# Patient Record
Sex: Female | Born: 1970 | Race: White | Hispanic: No | Smoking: Current every day smoker
Health system: Southern US, Community
[De-identification: ages and names within clinical notes are randomized; demographics above are authoritative.]

## PROBLEM LIST (undated history)

## (undated) DIAGNOSIS — F4001 Agoraphobia with panic disorder: Secondary | ICD-10-CM

## (undated) DIAGNOSIS — L709 Acne, unspecified: Secondary | ICD-10-CM

## (undated) DIAGNOSIS — F17200 Nicotine dependence, unspecified, uncomplicated: Secondary | ICD-10-CM

## (undated) DIAGNOSIS — E7800 Pure hypercholesterolemia, unspecified: Secondary | ICD-10-CM

## (undated) DIAGNOSIS — F419 Anxiety disorder, unspecified: Secondary | ICD-10-CM

## (undated) DIAGNOSIS — Z8669 Personal history of other diseases of the nervous system and sense organs: Secondary | ICD-10-CM

## (undated) DIAGNOSIS — K449 Diaphragmatic hernia without obstruction or gangrene: Secondary | ICD-10-CM

## (undated) DIAGNOSIS — I10 Essential (primary) hypertension: Secondary | ICD-10-CM

## (undated) HISTORY — DX: Acne, unspecified: L70.9

## (undated) HISTORY — DX: Nicotine dependence, unspecified, uncomplicated: F17.200

## (undated) HISTORY — DX: Diaphragmatic hernia without obstruction or gangrene: K44.9

## (undated) HISTORY — DX: Agoraphobia with panic disorder: F40.01

## (undated) HISTORY — DX: Anxiety disorder, unspecified: F41.9

## (undated) HISTORY — DX: Essential (primary) hypertension: I10

## (undated) HISTORY — DX: Pure hypercholesterolemia, unspecified: E78.00

## (undated) HISTORY — DX: Personal history of other diseases of the nervous system and sense organs: Z86.69

---

## 1998-02-25 ENCOUNTER — Ambulatory Visit (HOSPITAL_COMMUNITY): Admission: RE | Admit: 1998-02-25 | Discharge: 1998-02-25 | Payer: Self-pay | Admitting: Gastroenterology

## 1998-05-26 ENCOUNTER — Ambulatory Visit (HOSPITAL_COMMUNITY): Admission: RE | Admit: 1998-05-26 | Discharge: 1998-05-26 | Payer: Self-pay | Admitting: Obstetrics and Gynecology

## 1998-05-26 ENCOUNTER — Encounter: Payer: Self-pay | Admitting: Obstetrics and Gynecology

## 1998-05-30 ENCOUNTER — Encounter: Admission: RE | Admit: 1998-05-30 | Discharge: 1998-05-30 | Payer: Self-pay | Admitting: Internal Medicine

## 1998-11-07 ENCOUNTER — Encounter: Payer: Self-pay | Admitting: Gynecology

## 1998-11-07 ENCOUNTER — Encounter: Admission: RE | Admit: 1998-11-07 | Discharge: 1998-11-07 | Payer: Self-pay | Admitting: Gynecology

## 2000-10-12 ENCOUNTER — Encounter (INDEPENDENT_AMBULATORY_CARE_PROVIDER_SITE_OTHER): Payer: Self-pay | Admitting: Specialist

## 2000-10-12 ENCOUNTER — Inpatient Hospital Stay (HOSPITAL_COMMUNITY): Admission: RE | Admit: 2000-10-12 | Discharge: 2000-10-13 | Payer: Self-pay | Admitting: Gynecology

## 2001-05-11 ENCOUNTER — Other Ambulatory Visit: Admission: RE | Admit: 2001-05-11 | Discharge: 2001-05-11 | Payer: Self-pay | Admitting: Gynecology

## 2002-05-14 ENCOUNTER — Other Ambulatory Visit: Admission: RE | Admit: 2002-05-14 | Discharge: 2002-05-14 | Payer: Self-pay | Admitting: Gynecology

## 2003-06-05 ENCOUNTER — Other Ambulatory Visit: Admission: RE | Admit: 2003-06-05 | Discharge: 2003-06-05 | Payer: Self-pay | Admitting: Gynecology

## 2003-12-18 ENCOUNTER — Other Ambulatory Visit: Admission: RE | Admit: 2003-12-18 | Discharge: 2003-12-18 | Payer: Self-pay | Admitting: Gynecology

## 2004-07-16 ENCOUNTER — Other Ambulatory Visit: Admission: RE | Admit: 2004-07-16 | Discharge: 2004-07-16 | Payer: Self-pay | Admitting: Gynecology

## 2004-09-01 ENCOUNTER — Encounter: Admission: RE | Admit: 2004-09-01 | Discharge: 2004-09-01 | Payer: Self-pay | Admitting: Family Medicine

## 2005-04-28 ENCOUNTER — Encounter: Admission: RE | Admit: 2005-04-28 | Discharge: 2005-04-28 | Payer: Self-pay | Admitting: Family Medicine

## 2006-12-06 ENCOUNTER — Other Ambulatory Visit: Admission: RE | Admit: 2006-12-06 | Discharge: 2006-12-06 | Payer: Self-pay | Admitting: Gynecology

## 2007-10-04 ENCOUNTER — Emergency Department (HOSPITAL_COMMUNITY): Admission: EM | Admit: 2007-10-04 | Discharge: 2007-10-04 | Payer: Self-pay | Admitting: Emergency Medicine

## 2008-02-09 ENCOUNTER — Ambulatory Visit (HOSPITAL_COMMUNITY): Admission: RE | Admit: 2008-02-09 | Discharge: 2008-02-09 | Payer: Self-pay | Admitting: General Surgery

## 2008-02-09 ENCOUNTER — Encounter (INDEPENDENT_AMBULATORY_CARE_PROVIDER_SITE_OTHER): Payer: Self-pay | Admitting: General Surgery

## 2010-04-27 LAB — HCG, SERUM, QUALITATIVE: Preg, Serum: NEGATIVE

## 2010-04-27 LAB — HEMOGLOBIN AND HEMATOCRIT, BLOOD
HCT: 40.4 % (ref 36.0–46.0)
Hemoglobin: 13.5 g/dL (ref 12.0–15.0)

## 2010-05-26 NOTE — Op Note (Signed)
Victoria Ballard, Victoria Ballard               ACCOUNT NO.:  [id number]   MEDICAL RECORD NO.:  [id number]          PATIENT TYPE:  AMB   LOCATION:  DAY                          FACILITY:  [facility]   PHYSICIAN:  Juanetta Gosling, MDDATE OF BIRTH:  07/28/1970   DATE OF PROCEDURE:  02/09/2008  DATE OF DISCHARGE:                               OPERATIVE REPORT   PREOPERATIVE DIAGNOSIS:  Biliary colic.   POSTOPERATIVE DIAGNOSIS:  Chronic cholecystitis   PROCEDURE:  Laparoscopic cholecystectomy.   SURGEON:  Juanetta Gosling, M.D.   ASSISTANT:  None.   ANESTHESIA:  General.   FINDINGS:  Chronic cholecystitis.   SPECIMENS:  Gallbladder and contents to pathology.   COMPLICATIONS:  None.   DRAINS:  None.   ESTIMATED BLOOD LOSS:  Minimal.   DISPOSITION:  To recovery room in stable condition.   INDICATIONS:  Victoria Ballard is a 40 year old female who has had some  epigastric burning and right upper quadrant pain, radiating to her back.  She underwent an ultrasound showing cholelithiasis, no wall-thickening,  no pericholecystic fluid, no sonographic Murphy's and a normal common  bile duct.  Her liver function tests and lipase were also normal.  On  her exam she was mildly tender in the right upper quadrant.  I counseled  her for laparoscopic, possible open cholecystectomy.   PROCEDURE:  After informed consent was obtained, the patient was taken  to the operating.  She was administered 1 gram of cefoxitin.  Sequential  compressive devices were placed on her lower extremities prior to  induction.  She was then placed under general endotracheal anesthesia  without complication.  Her abdomen was then prepped and draped in  standard sterile surgical fashion.  The surgical time-out was then  performed.   A 10-mm vertical incision was then made below the umbilicus.  Dissection  was carried out down to the level of the fascia, which was entered  sharply.  The peritoneum was then entered bluntly.  A 0  Vicryl  pursestring suture was then placed through her fascia and a Hasson  trocar introduced.  Her abdomen was then insufflated to 15 mmHg  pressure.  Three further 5-mm ports were placed in the epigastrium and  right upper quadrant after infiltration with local anesthetic under  direct vision.   The gallbladder was then retracted cephalad.  There were some adhesions  to her duodenum which were taken down bluntly.  Eventually the  gallbladder was retracted cephalad and lateral.  The triangle of Calot  was then dissected, obtaining the critical view of safety with the  cystic duct and cystic artery present.  At this point I then placed  three clips proximally and one clip distally on the cystic duct and  divided it and did the same with the cystic artery.  Hook cautery was  then used to remove the gallbladder from the liver bed.  The gallbladder  was very adherent to her liver bed and there was a small spillage of  bile at the end of this portion.  The gallbladder was then removed from  the liver bed, placed in  an EndoCatch bag and removed from the  umbilicus.  Irrigation was then performed with 2 liters of saline until  this was clear.  Hemostasis was observed on the liver bed.   The umbilical trocar was then removed.  This was tied down and there was  no further defect after securing that suture.  The remainder of the  trocars were removed and the abdomen was desufflated.  All the incisions  were then closed with 4-0 Monocryl in subcuticular fashion.  Dermabond  was placed over all the wounds.  She was extubated in the operating and  transferred to the recovery room in stable condition, having tolerated  this well.      Juanetta Gosling, MD  Electronically Signed     MCW/MEDQ  D:  02/09/2008  T:  02/09/2008  Job:  [id number]   cc:   L. Lupe Carney, M.D.  Fax: [phone]

## 2010-05-29 NOTE — Op Note (Signed)
[facility]  Patient:    Victoria Ballard, Victoria Ballard Visit Number: [id number] MRN: [id number]          Service Type: GYN Location: 1S X004 01 Attending Physician:  Teodora Medici Cabitt Proc. Date: 10/12/00 Admit Date:  10/12/2000   CC:         Harl Bowie, M.D.  Beather Arbour Thomasena Edis, M.D., Ph.D.   Operative Report  PREOPERATIVE DIAGNOSES: 1. Ovarian cyst. 2. Pelvic pain.  POSTOPERATIVE DIAGNOSIS: 1. Ovarian cyst. 2. Pelvic pain. 3. Adhesions.  OPERATION PERFORMED:  A left ovarian cystectomy and lysis of adhesions.  SURGEON:  Leatha Gilding. Mezer, M.D.  ASSISTANT:  Harl Bowie, M.D.  ANESTHESIA:  General endotracheal.  PREPARATION:  Betadine.  DESCRIPTION OF PROCEDURE:  With the patient in the supine position, was prepped and draped in the routine fashion.  A Pfannenstiel incision was made through the skin and subcutaneous tissue.  The fascia and peritoneum were opened without difficulty.  Upon entering the peritoneal cavity, pelvic washings were obtained.  Exploration of the pelvis revealed the uterus to be normal in size and contour.  The right ovary was lightly adherent to the pelvic sidewall, and the right fallopian tube was free of adhesions.  The left ovary was densely adherent to the pelvic sidewall and had dense adhesions of bowel to the ovary.  The fallopian tube was lightly adherent to the ovary. The bowel was freed from the ovary with careful blunt and sharp dissection. The ovary was freed enough from the pelvic sidewall to be able to approach the ovarian cyst.  The cyst was adherent to the bowel and leaked chocolate fluid during the dissection process.  The ovary was then opened on the superior pole.  The shell of the endometrioma, I dissected free, and this was also accomplished with the lower cyst.  Both cysts were closed with Georgiann Mccoy type sutures of 3-0 Vicryl.  It should be noted that the tissue throughout the pelvis was  thickened from either endometriosis or reaction to endometriosis. The fimbria, although present, appeared to be agglutinated bilaterally.  The pelvis was irrigated with copious amounts of warm Lactated Ringers solution, and hemostasis was noted to be intact.  At the completion of the procedure, an effort was made to place the large bowel in the cul-de-sac.  The omentum was brought down, and the abdomen was closed in layers using a running 2-0 Vicryl on the peritoneum, running 0 Vicryl to the midline bilaterally on the fascia. Hemostasis was assured in the subcutaneous tissue, and the skin was closed with staples.  The estimated blood loss was approximately 100 cc.  Sponge, needle and instrument counts were correct x 2.  The patient tolerated the procedure well and was taken to the recovery room in satisfactory condition. Attending Physician:  Rolinda Roan DD:  10/12/00 TD:  10/12/00 Job: [id number]

## 2010-05-29 NOTE — H&P (Signed)
[facility]  Patient:    Victoria Ballard, Victoria Ballard. Visit Number: [id number] MRN: [id number]          Service Type: Attending:  Leatha Gilding. Mezer, M.D. Dictated by:   Leatha Gilding. Mezer, M.D. Adm. Date:  10/12/00   CC:         Lafonda Mosses B. Thomasena Edis, M.D.   History and Physical  ADMITTING DIAGNOSES:  Ovarian cyst.  HISTORY OF PRESENT ILLNESS:  The patient is a 40 year old nulligravida white female admitted with persistent ovarian cyst and pelvic pain, for exploratory laparotomy.  The patient has a long history of dysmenorrhea, dyspareunia, and pelvic pain, and underwent a laparoscopy in September 2001, at which time severe deep endometriosis of the bladder, cul-de-sac, and ovaries was noted. The left ovary was adherent to the pelvic sidewall and was freed.  The left tube was adherent to the ovary, and this was also freed.  The patient has continued to have significant episodes of pelvic pain since that surgery and has a persistent left ovarian cyst.  Her CA-125 is 6.3, with normal being up to 21.  The patients pain has been unresponsive to oral contraceptive and conservative management.  Exploratory laparotomy has been discussed with the patient in detail, and efforts will be made to remove the ovarian cyst and to spare the ovary.  Given the previous significant endometriosis and extensive adhesions, the possibility that the tube or ovary would need to be removed has been addressed with the patient.  The possibility of ovarian cancer has been discussed with the patient.  The potential complications of the surgery, including but not limited to anesthesia, injury to the bowel, bladder, ureters, and possible blood loss which might require transfusion, possible infection of the pelvis with the wound, have been discussed with the patient in detail.  The postoperative expectations and restrictions have also been reviewed.  The patients questions have been answered, and she  understands that there is no guarantee to relieve her pain or to achieve pregnancy as a result of this surgery.  PAST SURGICAL HISTORY:  Diagnostic laparoscopy.  PAST MEDICAL HISTORY:  Noncontributory.  MEDICATIONS:  Paxil, Xanax, Prevacid, and Maxalt.  ALLERGIES:  None known.  SOCIAL HISTORY:  Smokes one pack of cigarettes per day.  Alcohol:  None.  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  FAMILY HISTORY:  Positive for cardiovascular disease, colon cancer, and endometriosis.  PHYSICAL EXAMINATION:  HEENT:  Negative.  CHEST:  The lungs are clear.  CARDIAC:  The heart is without murmurs.  ABDOMEN:  Soft and nontender.  PELVIC:  The pelvic exam reveals the BUS, vagina, and cervix to be normal. The uterus is anterior and normal.  The adnexa are without palpable masses.  EXTREMITIES:  Negative.  IMPRESSION:  Pelvic pain, ovarian cyst, dysmenorrhea, dyspareunia, endometriosis.  PLAN:  Exploratory laparotomy with ovarian cystectomy and indicated procedures. Dictated by:   Leatha Gilding. Mezer, M.D. Attending:  Leatha Gilding. Mezer, M.D. DD:  10/12/00 TD:  10/12/00 Job: [id number]

## 2010-06-18 ENCOUNTER — Ambulatory Visit: Payer: Self-pay | Admitting: Psychology

## 2010-10-12 LAB — COMPREHENSIVE METABOLIC PANEL
Alkaline Phosphatase: 83
BUN: 13
Calcium: 9.2
Glucose, Bld: 121 — ABNORMAL HIGH
Potassium: 3.6
Total Protein: 7.3

## 2010-10-12 LAB — CBC
HCT: 40.7
Hemoglobin: 13.6
MCHC: 33.5
MCV: 91.3
RDW: 13.7

## 2010-10-12 LAB — URINALYSIS, ROUTINE W REFLEX MICROSCOPIC
Leukocytes, UA: NEGATIVE
Nitrite: NEGATIVE
Specific Gravity, Urine: 1.035 — ABNORMAL HIGH
Urobilinogen, UA: 1

## 2010-10-12 LAB — PREGNANCY, URINE: Preg Test, Ur: NEGATIVE

## 2010-10-12 LAB — DIFFERENTIAL
Basophils Relative: 1
Monocytes Relative: 5
Neutro Abs: 10.8 — ABNORMAL HIGH
Neutrophils Relative %: 80 — ABNORMAL HIGH

## 2010-10-12 LAB — URINE MICROSCOPIC-ADD ON

## 2011-05-25 ENCOUNTER — Other Ambulatory Visit: Payer: Self-pay | Admitting: Obstetrics and Gynecology

## 2011-05-25 DIAGNOSIS — Z1231 Encounter for screening mammogram for malignant neoplasm of breast: Secondary | ICD-10-CM

## 2011-06-11 ENCOUNTER — Ambulatory Visit: Payer: Self-pay

## 2013-05-11 ENCOUNTER — Other Ambulatory Visit: Payer: Self-pay | Admitting: Specialist

## 2013-05-11 DIAGNOSIS — IMO0002 Reserved for concepts with insufficient information to code with codable children: Secondary | ICD-10-CM

## 2013-05-11 DIAGNOSIS — G43709 Chronic migraine without aura, not intractable, without status migrainosus: Secondary | ICD-10-CM

## 2013-05-22 ENCOUNTER — Ambulatory Visit
Admission: RE | Admit: 2013-05-22 | Discharge: 2013-05-22 | Disposition: A | Discharge status: Home / Self Care | Payer: PRIVATE HEALTH INSURANCE | Source: Ambulatory Visit | Attending: Specialist | Admitting: Specialist

## 2013-05-22 DIAGNOSIS — IMO0002 Reserved for concepts with insufficient information to code with codable children: Secondary | ICD-10-CM

## 2013-05-22 DIAGNOSIS — G43709 Chronic migraine without aura, not intractable, without status migrainosus: Secondary | ICD-10-CM

## 2014-01-21 ENCOUNTER — Ambulatory Visit (INDEPENDENT_AMBULATORY_CARE_PROVIDER_SITE_OTHER): Payer: PRIVATE HEALTH INSURANCE | Admitting: Podiatry

## 2014-01-21 ENCOUNTER — Encounter: Payer: Self-pay | Admitting: Podiatry

## 2014-01-21 ENCOUNTER — Ambulatory Visit: Payer: Self-pay

## 2014-01-21 VITALS — BP 135/90 | HR 86 | Resp 16

## 2014-01-21 DIAGNOSIS — M79671 Pain in right foot: Secondary | ICD-10-CM

## 2014-01-21 DIAGNOSIS — L600 Ingrowing nail: Secondary | ICD-10-CM

## 2014-01-21 DIAGNOSIS — B351 Tinea unguium: Secondary | ICD-10-CM

## 2014-01-21 MED ORDER — TERBINAFINE HCL 250 MG PO TABS: 250.0000 mg | ORAL_TABLET | Freq: Every day | ORAL | Status: AC

## 2014-01-21 NOTE — Patient Instructions (Addendum)
ANTIBACTERIAL SOAP INSTRUCTIONS  THE DAY AFTER PROCEDURE   For the first dressing change (soak) Place 3-4 drops of antibacterial liquid soap in a quart of warm tap water.  Submerge foot into water for 20 minutes.  If bandage was applied after your procedure, leave on to allow for easy lift off, then remove and continue with soak for the remaining time.  Next, blot area dry with a soft cloth and apply antibiotic ointment such as polysporin, neosporin, or triple antibiotic ointment.  You may then switch to the instructions below    Shower as usual. Before getting out, place a drop of antibacterial liquid soap (Dial) on a wet, clean washcloth.  Gently wipe washcloth over affected area.  Afterward, rinse the area with warm water.  Blot the area dry with a soft cloth and cover with antibiotic ointment (neosporin, polysporin, bacitracin) and band aid or gauze and tape    Long Term Care Instructions-Post Nail Surgery  You have had your ingrown toenail and root treated with a chemical.  This chemical causes a burn that will drain and ooze like a blister.  1-2 weeks after the procedure you may leave the area open to air at night to help dry it up.  During the day,  It is important to keep this area clean and covered until the toe dries out and forms a scab. Once the scab forms you no longer need to soak or apply a dressing.  If at any time you experience an increase in pain, redness, swelling, or drainage, you should contact the office as soon as possible.      Onychomycosis/Fungal Toenails  WHAT IS IT? An infection that lies within the keratin of your nail plate that is caused by a fungus.  WHY ME? Fungal infections affect all ages, sexes, races, and creeds.  There may be many factors that predispose you to a fungal infection such as age, coexisting medical conditions such as diabetes, or an autoimmune disease; stress, medications, fatigue, genetics, etc.  Bottom line: fungus thrives in a warm,  moist environment and your shoes offer such a location.  IS IT CONTAGIOUS? Theoretically, yes.  You do not want to share shoes, nail clippers or files with someone who has fungal toenails.  Walking around barefoot in the same room or sleeping in the same bed is unlikely to transfer the organism.  It is important to realize, however, that fungus can spread easily from one nail to the next on the same foot.  HOW DO WE TREAT THIS?  There are several ways to treat this condition.  Treatment may depend on many factors such as age, medications, pregnancy, liver and kidney conditions, etc.  It is best to ask your doctor which options are available to you.  1. No treatment.   Unlike many other medical concerns, you can live with this condition.  However for many people this can be a painful condition and may lead to ingrown toenails or a bacterial infection.  It is recommended that you keep the nails cut short to help reduce the amount of fungal nail. 2. Topical treatment.  These range from herbal remedies to prescription strength nail lacquers.  About 40-50% effective, topicals require twice daily application for approximately 9 to 12 months or until an entirely new nail has grown out.  The most effective topicals are medical grade medications available through physicians offices. 3. Oral antifungal medications.  With an 80-90% cure rate, the most common oral medication requires 3  to 4 months of therapy and stays in your system for a year as the new nail grows out.  Oral antifungal medications do require blood work to make sure it is a safe drug for you.  A liver function panel will be performed prior to starting the medication and after the first month of treatment.  It is important to have the blood work performed to avoid any harmful side effects.  In general, this medication safe but blood work is required. 4. Laser Therapy.  This treatment is performed by applying a specialized laser to the affected nail  plate.  This therapy is noninvasive, fast, and non-painful.  It is not covered by insurance and is therefore, out of pocket.  The results have been very good with a 80-95% cure rate.  The [facility] is the only practice in the area to offer this therapy. 5. Permanent Nail Avulsion.  Removing the entire nail so that a new nail will not grow back.

## 2014-01-21 NOTE — Progress Notes (Signed)
Subjective:     Patient ID: Victoria Ballard, female   DOB: 09/05/1970, 44 y.o.   MRN: [id number]  HPI patient presents with nail disease of approximately 8 nails with the first and the fourth and the fifth nails right foot being the worse with the fourth nail right being very thick and painful when she wears shoe gear   Review of Systems  All other systems reviewed and are negative.      Objective:   Physical Exam  Constitutional: She is oriented to person, place, and time.  Cardiovascular: Intact distal pulses.   Musculoskeletal: Normal range of motion.  Neurological: She is oriented to person, place, and time.  Skin: Skin is warm.  Nursing note and vitals reviewed.  neurovascular status intact with muscle strength adequate and range of motion subtalar and midtarsal joint within normal limits. 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 Form aggressive conservative treatment plan I have recommended oral Lamisil along with topical formulas 3 and laser treatment. Explained this may or may not solve the problem completely but she wants to undergo this route but because the fourth nail is so thick and so painful I have recommended removal. She wants this performed and I explained risk and today I infiltrated 60 mg Xylocaine Marcaine mixture and removed the fourth nails exposing the matrix and applying phenol for applications 30 seconds followed by alcohol lavaged and sterile dressing. Reappoint in 3 weeks for laser therapy 8 nails and dispensed formula 3 and Lamisil and she will also begin soaks and will be seen back for  procedure

## 2014-01-21 NOTE — Progress Notes (Signed)
   Subjective:    Patient ID: Victoria Ballard, female    DOB: [birth year], 44 y.o.   MRN: [id number]  HPI Comments: "I have toenail fungus"  Patient c/o tender 1st, 4th, and 5th toes right for about 10 years. She has thick, dark nails. She has tried OTC and seen doc and Rx'd meds. No help.      Review of Systems  All other systems reviewed and are negative.      Objective:   Physical Exam        Assessment & Plan:

## 2014-02-25 ENCOUNTER — Ambulatory Visit: Payer: PRIVATE HEALTH INSURANCE | Admitting: Podiatry

## 2014-03-04 ENCOUNTER — Encounter: Payer: Self-pay | Admitting: Podiatry

## 2014-03-04 ENCOUNTER — Ambulatory Visit (INDEPENDENT_AMBULATORY_CARE_PROVIDER_SITE_OTHER): Payer: PRIVATE HEALTH INSURANCE | Admitting: Podiatry

## 2014-03-04 DIAGNOSIS — B351 Tinea unguium: Secondary | ICD-10-CM

## 2014-03-04 DIAGNOSIS — M79673 Pain in unspecified foot: Secondary | ICD-10-CM

## 2014-03-04 NOTE — Progress Notes (Signed)
Subjective:     Patient ID: Victoria Ballard, female   DOB: 1970-06-02, 44 y.o.   MRN: [id number]  HPI patient presents with nail disease of 8 nails with the right big toenail being the worst   Review of Systems     Objective:   Physical Exam Neurovascular status intact with thick yellow brittle nailbeds noted on the above-mentioned nail    Assessment:     Mycotic nails    Plan:     Laser approximate 2000 pulses performed today and tolerated well

## 2014-04-15 ENCOUNTER — Ambulatory Visit: Payer: PRIVATE HEALTH INSURANCE | Admitting: Podiatry

## 2014-04-17 ENCOUNTER — Ambulatory Visit: Payer: PRIVATE HEALTH INSURANCE | Admitting: Podiatry

## 2014-05-27 ENCOUNTER — Ambulatory Visit: Payer: PRIVATE HEALTH INSURANCE | Admitting: Podiatry

## 2014-06-03 ENCOUNTER — Ambulatory Visit (INDEPENDENT_AMBULATORY_CARE_PROVIDER_SITE_OTHER): Payer: PRIVATE HEALTH INSURANCE | Admitting: Podiatry

## 2014-06-03 DIAGNOSIS — B351 Tinea unguium: Secondary | ICD-10-CM

## 2014-06-04 NOTE — Progress Notes (Signed)
Subjective:     Patient ID: Victoria Ballard, female   DOB: February 10, 1970, 44 y.o.   MRN: [id number]  HPI patient states the nails seems to be improving but there still is discoloration   Review of Systems     Objective:   Physical Exam Neurovascular status intact with nail disease the distal two thirds nail right    Assessment:     Mycotic nail infection which seems to be gradually improving with laser and terbinafine    Plan:     Finish terbinafine and laser today of approximate 1000 pulses was accomplished

## 2014-06-05 ENCOUNTER — Encounter: Payer: Self-pay | Admitting: *Deleted

## 2014-09-30 ENCOUNTER — Ambulatory Visit: Payer: PRIVATE HEALTH INSURANCE | Admitting: Podiatry

## 2014-10-07 ENCOUNTER — Ambulatory Visit (INDEPENDENT_AMBULATORY_CARE_PROVIDER_SITE_OTHER): Payer: PRIVATE HEALTH INSURANCE | Admitting: Podiatry

## 2014-10-07 DIAGNOSIS — B351 Tinea unguium: Secondary | ICD-10-CM

## 2014-10-07 DIAGNOSIS — L600 Ingrowing nail: Secondary | ICD-10-CM

## 2014-10-07 MED ORDER — UREA 40 % EX OINT: TOPICAL_OINTMENT | CUTANEOUS | Status: AC | PRN

## 2014-10-07 NOTE — Progress Notes (Signed)
Subjective:     Patient ID: Victoria Ballard, female   DOB: [birth year], 44 y.o.   MRN: [id number]  HPI patient presents with thick dystrophic nail right big toe that she thinks is getting some better but she admits she has not been taking her oral medicine as she was supposed   Review of Systems     Objective:   Physical Exam Neurovascular status intact with some discoloration right big toenail and obvious trauma which has occurred over the years    Assessment:     Mycotic nail infection which is responding to laser to a moderate degree but requires other treatment processes    Plan:     Added in a year read agent today and also patient will begin taking the oral anti-fungal as indicated. 1000 pulses applied to big toenail fifth toenail right foot and reappoint as needed

## 2018-03-06 DIAGNOSIS — N951 Menopausal and female climacteric states: Secondary | ICD-10-CM | POA: Diagnosis not present

## 2018-03-06 DIAGNOSIS — Z01419 Encounter for gynecological examination (general) (routine) without abnormal findings: Secondary | ICD-10-CM | POA: Diagnosis not present

## 2018-03-06 DIAGNOSIS — Z6828 Body mass index (BMI) 28.0-28.9, adult: Secondary | ICD-10-CM | POA: Diagnosis not present

## 2018-03-06 DIAGNOSIS — Z1231 Encounter for screening mammogram for malignant neoplasm of breast: Secondary | ICD-10-CM | POA: Diagnosis not present

## 2018-03-08 ENCOUNTER — Other Ambulatory Visit: Payer: Self-pay | Admitting: Obstetrics and Gynecology

## 2018-03-08 DIAGNOSIS — R928 Other abnormal and inconclusive findings on diagnostic imaging of breast: Secondary | ICD-10-CM

## 2018-03-13 ENCOUNTER — Ambulatory Visit
Admission: RE | Admit: 2018-03-13 | Discharge: 2018-03-13 | Disposition: A | Discharge status: Home / Self Care | Payer: PRIVATE HEALTH INSURANCE | Source: Ambulatory Visit | Attending: Obstetrics and Gynecology | Admitting: Obstetrics and Gynecology

## 2018-03-13 ENCOUNTER — Other Ambulatory Visit: Payer: Self-pay | Admitting: Obstetrics and Gynecology

## 2018-03-13 DIAGNOSIS — R928 Other abnormal and inconclusive findings on diagnostic imaging of breast: Secondary | ICD-10-CM

## 2018-03-13 DIAGNOSIS — R922 Inconclusive mammogram: Secondary | ICD-10-CM | POA: Diagnosis not present

## 2018-03-13 DIAGNOSIS — N6489 Other specified disorders of breast: Secondary | ICD-10-CM | POA: Diagnosis not present

## 2018-03-13 DIAGNOSIS — N63 Unspecified lump in unspecified breast: Secondary | ICD-10-CM

## 2018-03-16 DIAGNOSIS — J358 Other chronic diseases of tonsils and adenoids: Secondary | ICD-10-CM | POA: Diagnosis not present

## 2018-03-16 DIAGNOSIS — I10 Essential (primary) hypertension: Secondary | ICD-10-CM | POA: Diagnosis not present

## 2018-03-16 DIAGNOSIS — R51 Headache: Secondary | ICD-10-CM | POA: Diagnosis not present

## 2018-03-16 DIAGNOSIS — Z720 Tobacco use: Secondary | ICD-10-CM | POA: Diagnosis not present

## 2018-04-26 DIAGNOSIS — Z720 Tobacco use: Secondary | ICD-10-CM | POA: Diagnosis not present

## 2018-04-26 DIAGNOSIS — F419 Anxiety disorder, unspecified: Secondary | ICD-10-CM | POA: Diagnosis not present

## 2018-04-26 DIAGNOSIS — I10 Essential (primary) hypertension: Secondary | ICD-10-CM | POA: Diagnosis not present

## 2018-04-26 DIAGNOSIS — G43909 Migraine, unspecified, not intractable, without status migrainosus: Secondary | ICD-10-CM | POA: Diagnosis not present

## 2018-11-20 DIAGNOSIS — Z0000 Encounter for general adult medical examination without abnormal findings: Secondary | ICD-10-CM | POA: Diagnosis not present

## 2018-11-20 DIAGNOSIS — F419 Anxiety disorder, unspecified: Secondary | ICD-10-CM | POA: Diagnosis not present

## 2018-11-20 DIAGNOSIS — G43909 Migraine, unspecified, not intractable, without status migrainosus: Secondary | ICD-10-CM | POA: Diagnosis not present

## 2018-11-20 DIAGNOSIS — Z5181 Encounter for therapeutic drug level monitoring: Secondary | ICD-10-CM | POA: Diagnosis not present

## 2018-11-20 DIAGNOSIS — I10 Essential (primary) hypertension: Secondary | ICD-10-CM | POA: Diagnosis not present

## 2018-11-20 DIAGNOSIS — Z720 Tobacco use: Secondary | ICD-10-CM | POA: Diagnosis not present

## 2018-11-20 DIAGNOSIS — Z8249 Family history of ischemic heart disease and other diseases of the circulatory system: Secondary | ICD-10-CM | POA: Diagnosis not present

## 2019-08-06 DIAGNOSIS — E7800 Pure hypercholesterolemia, unspecified: Secondary | ICD-10-CM | POA: Diagnosis not present

## 2019-08-06 DIAGNOSIS — F419 Anxiety disorder, unspecified: Secondary | ICD-10-CM | POA: Diagnosis not present

## 2019-08-06 DIAGNOSIS — I10 Essential (primary) hypertension: Secondary | ICD-10-CM | POA: Diagnosis not present

## 2019-08-06 DIAGNOSIS — G43909 Migraine, unspecified, not intractable, without status migrainosus: Secondary | ICD-10-CM | POA: Diagnosis not present

## 2020-02-04 DIAGNOSIS — E7800 Pure hypercholesterolemia, unspecified: Secondary | ICD-10-CM | POA: Diagnosis not present

## 2020-02-04 DIAGNOSIS — I10 Essential (primary) hypertension: Secondary | ICD-10-CM | POA: Diagnosis not present

## 2020-02-04 DIAGNOSIS — F419 Anxiety disorder, unspecified: Secondary | ICD-10-CM | POA: Diagnosis not present

## 2020-02-04 DIAGNOSIS — Z0000 Encounter for general adult medical examination without abnormal findings: Secondary | ICD-10-CM | POA: Diagnosis not present

## 2020-08-22 IMAGING — MG DIGITAL DIAGNOSTIC UNILATERAL LEFT MAMMOGRAM WITH TOMO AND CAD
6 series · 6 of 18 positions shown · non-contrast
Comparison: Previous exam(s).

CLINICAL DATA: Callback from screening mammogram for possible
asymmetry left breast.

EXAM:
DIGITAL DIAGNOSTIC LEFT MAMMOGRAM WITH CAD AND TOMO
ULTRASOUND LEFT BREAST

[L MLO synth-2D (1 of 2)]
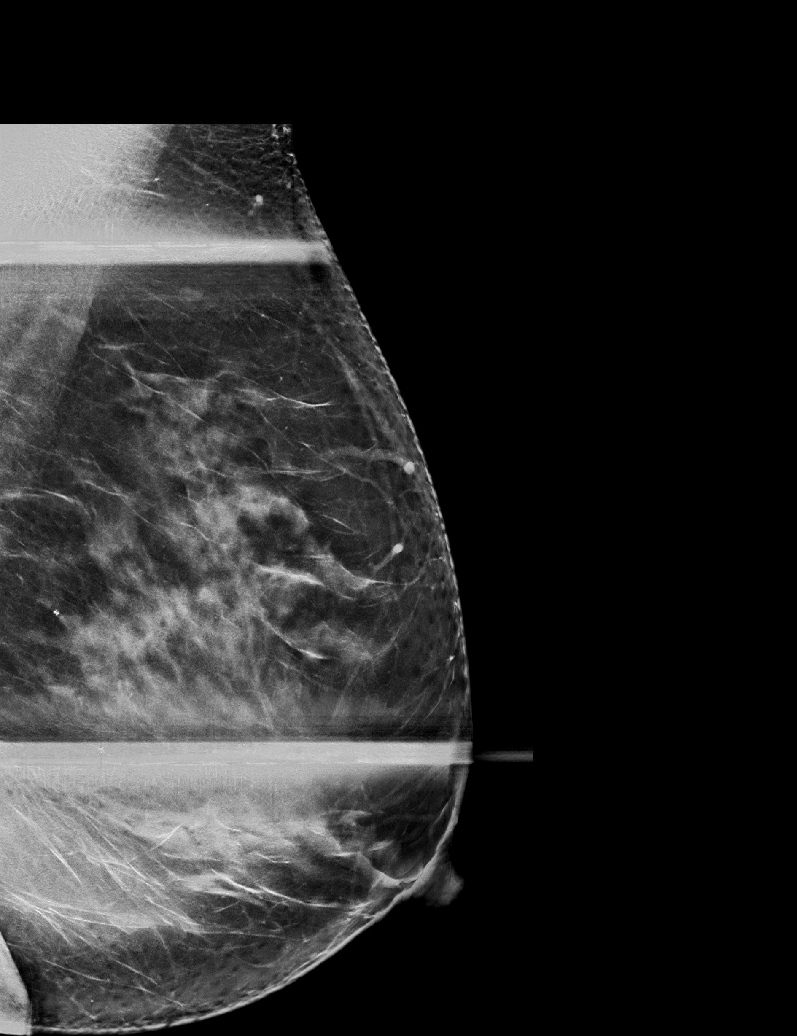

[L MLO synth-2D (2 of 2)]
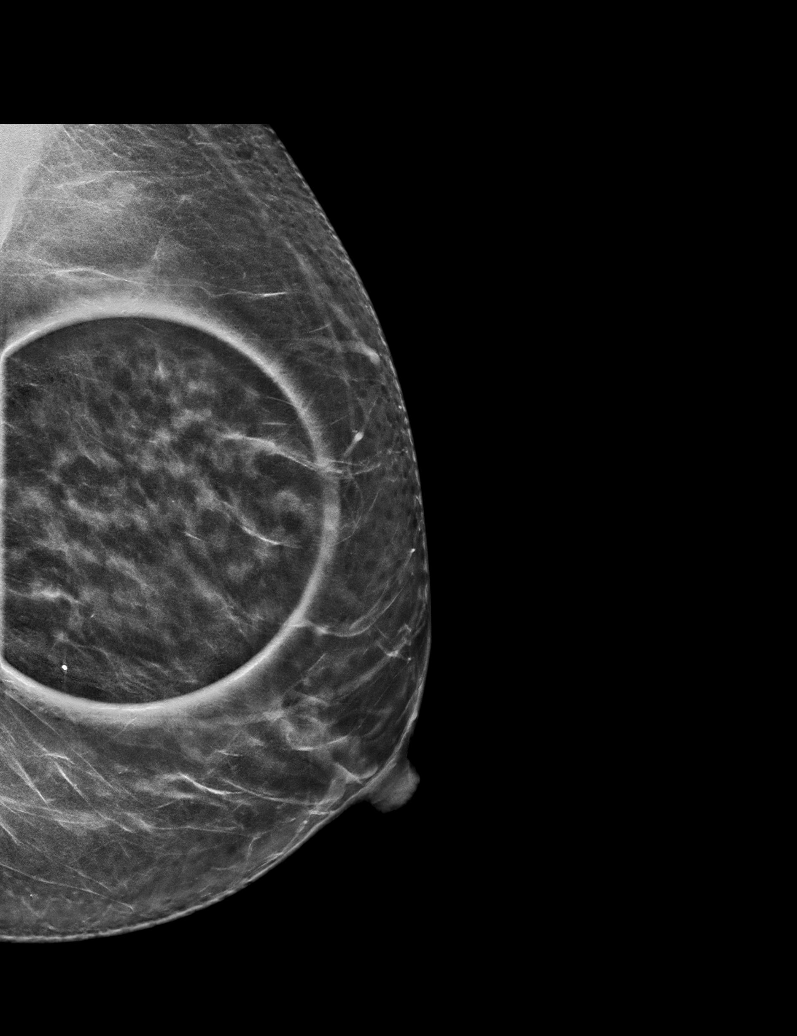

[L CC synth-2D]
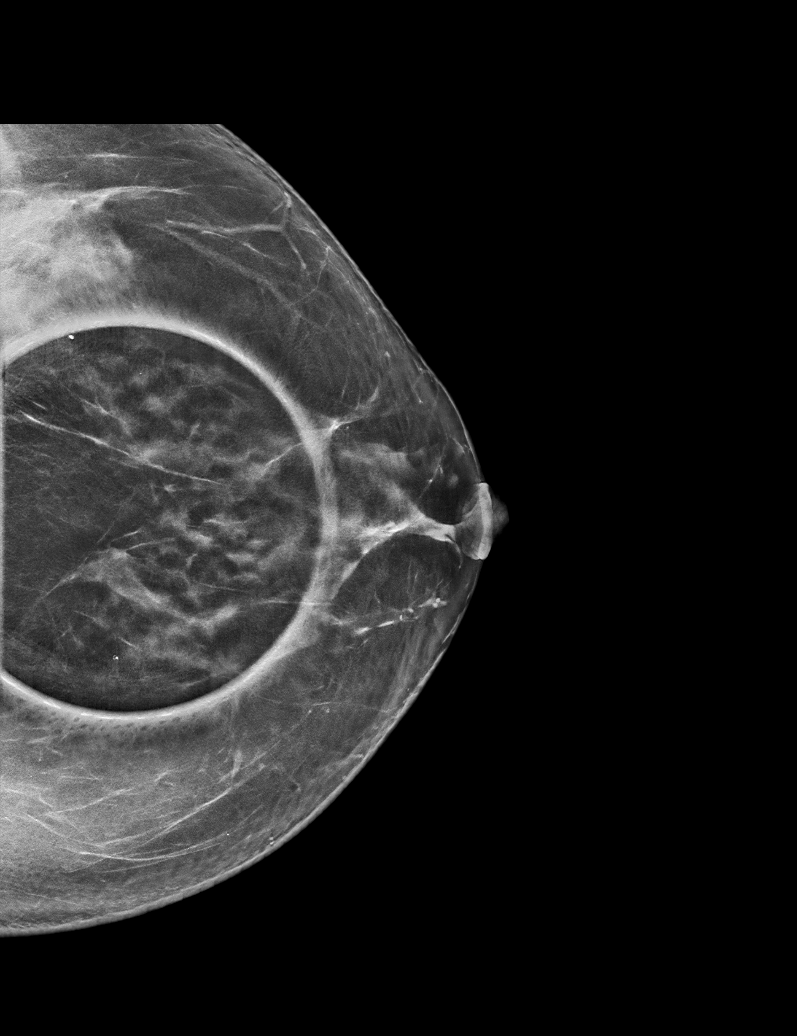

[L CC tomo · tomo slice 34/67.0]
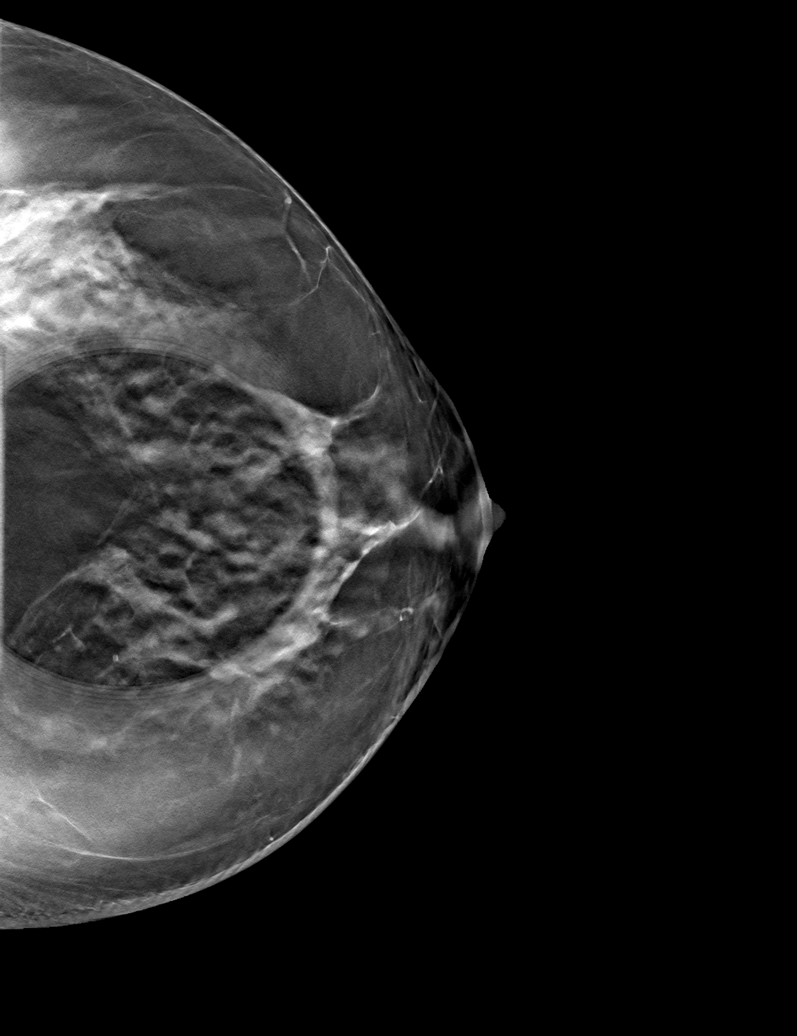

[L MLO tomo (1 of 2) · tomo slice 35/68.0]
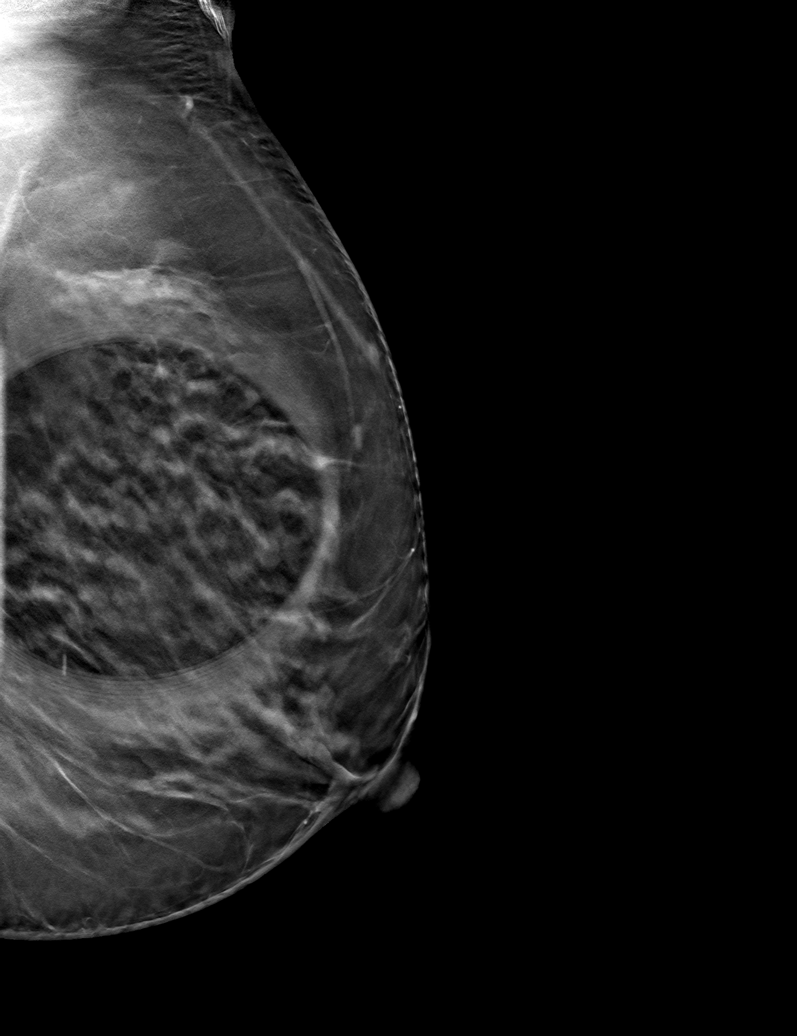

[L MLO tomo (2 of 2) · tomo slice 40/79.0]
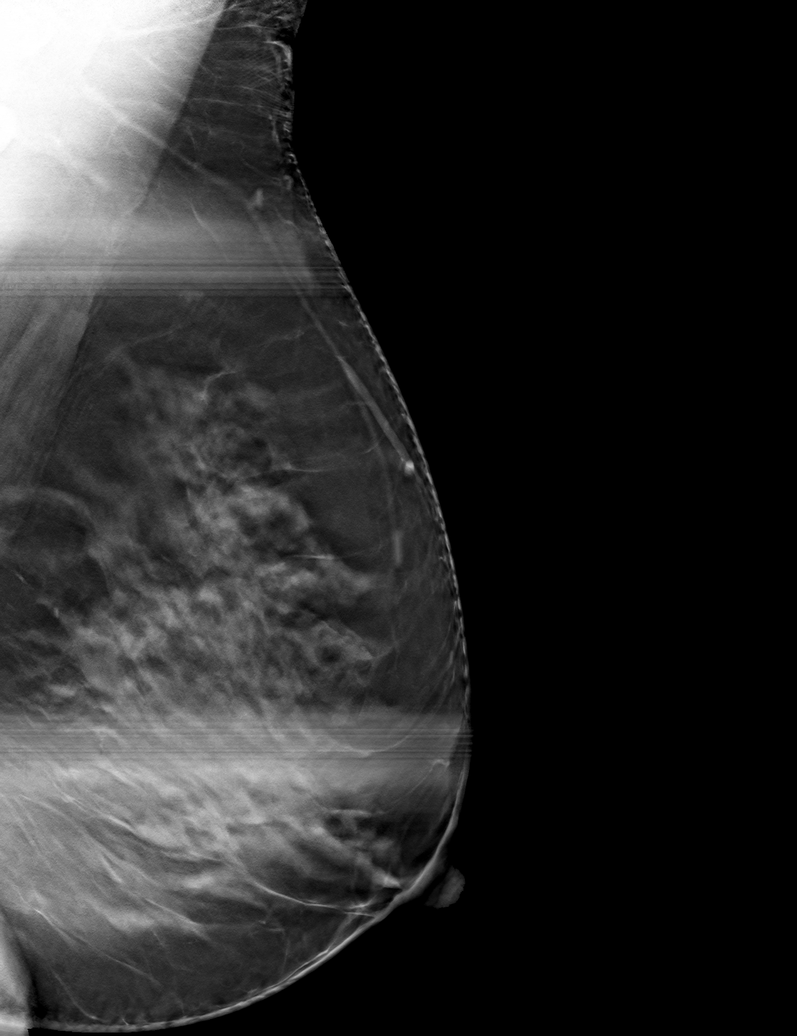

[6 of 18 positions shown; findings below may reference images not displayed]

ACR Breast Density Category c: The breast tissue is heterogeneously
dense, which may obscure small masses.
FINDINGS: Compression left CC view and left MLO view are submitted. The
previously questioned asymmetry in the posteromedial left breast
persists on spot compression left CC view is not seen on spot
compression left MLO views. The breast parenchyma in this area on
the spot compression left CC view is unchanged compared to prior
exam of October 25, 2016

Mammographic images were processed with CAD.

Targeted ultrasound is performed, showing no focal abnormal discrete
cystic or solid lesion in the medial left breast that would
correlate to the mammographic finding.
IMPRESSION: Probable benign findings.

RECOMMENDATION:
Six-month follow-up mammogram left breast.

I have discussed the findings and recommendations with the patient.
Results were also provided in writing at the conclusion of the
visit. If applicable, a reminder letter will be sent to the patient
regarding the next appointment.

BI-RADS CATEGORY  3: Probably benign.

## 2021-02-05 DIAGNOSIS — G43909 Migraine, unspecified, not intractable, without status migrainosus: Secondary | ICD-10-CM | POA: Diagnosis not present

## 2021-02-05 DIAGNOSIS — R002 Palpitations: Secondary | ICD-10-CM | POA: Diagnosis not present

## 2021-02-05 DIAGNOSIS — Z0000 Encounter for general adult medical examination without abnormal findings: Secondary | ICD-10-CM | POA: Diagnosis not present

## 2021-02-05 DIAGNOSIS — E7800 Pure hypercholesterolemia, unspecified: Secondary | ICD-10-CM | POA: Diagnosis not present

## 2021-02-05 DIAGNOSIS — F419 Anxiety disorder, unspecified: Secondary | ICD-10-CM | POA: Diagnosis not present

## 2021-02-05 DIAGNOSIS — I10 Essential (primary) hypertension: Secondary | ICD-10-CM | POA: Diagnosis not present

## 2021-02-09 ENCOUNTER — Encounter: Payer: Self-pay | Admitting: *Deleted

## 2021-02-09 ENCOUNTER — Other Ambulatory Visit: Payer: Self-pay | Admitting: Physician Assistant

## 2021-02-09 ENCOUNTER — Ambulatory Visit (INDEPENDENT_AMBULATORY_CARE_PROVIDER_SITE_OTHER): Payer: BC Managed Care – PPO

## 2021-02-09 DIAGNOSIS — R002 Palpitations: Secondary | ICD-10-CM

## 2021-02-09 NOTE — Progress Notes (Unsigned)
Enrolled for [employer] to mail a ZIO XT long term holter monitor to the patients address on file.  Letter with instructions mailed to patient. 

## 2021-02-11 DIAGNOSIS — R002 Palpitations: Secondary | ICD-10-CM

## 2021-03-20 DIAGNOSIS — M25722 Osteophyte, left elbow: Secondary | ICD-10-CM | POA: Diagnosis not present

## 2021-03-20 DIAGNOSIS — M7712 Lateral epicondylitis, left elbow: Secondary | ICD-10-CM | POA: Diagnosis not present

## 2021-03-22 NOTE — Progress Notes (Deleted)
?Cardiology Office Note:   ? ?Date:  03/22/2021  ? ?ID:  Victoria Ballard, DOB 1970-03-08, MRN [id number] ? ?PCP:  Clovis Riley, L.August Saucer, MD  ? ?[facility] Providers ?Cardiologist:  Alverda Skeans, MD ?Referring MD: Milus Height, PA  ? ?Chief Complaint/Reason for Referral: Palpitations ? ?ASSESSMENT:   ? ?Palpitations ? ?Hyperlipidemia, unspecified hyperlipidemia type ? ?Hypertension, unspecified type ? ?Tobacco abuse ? ?PLAN:   ? ?In order of problems listed above: ? ?1.  The patient had a monitor reassuring results.  Her laboratories including a TSH were within normal limits.  We will refer her for an echocardiogram.  Follow-up in 6 months or earlier if needed.   ?2.  This is being followed by the patient's primary care provider. ?3.  Hypertension ?4.  Stressed need for abstinence. ? ?     ? ?{Are you ordering a CV Procedure (e.g. stress test, cath, DCCV, TEE, etc)?   Press F2        :[id number]}  ? ?Dispo:  No follow-ups on file.  ?  ? ?Medication Adjustments/Labs and Tests Ordered: ?Current medicines are reviewed at length with the patient today.  Concerns regarding medicines are outlined above.  ? ?Tests Ordered: ?No orders of the defined types were placed in this encounter. ? ? ?Medication Changes: ?No orders of the defined types were placed in this encounter. ? ? ?History of Present Illness:   ? ?FOCUSED PROBLEM LIST:   ?1.  Hypertension ?2.  Hyperlipidemia ?3.  Family history of myocardial infarction  ?4.  Ongoing tobacco abuse ? ? ?The patient is a 51 y.o. female with the indicated medical history here for recommendations regarding palpitations.  Patient was seen by her primary care provider recently with complaints of palpitations.  She said she has had palpitations for about a year almost every night.  They last about an hour and there are no alleviating factors.  She noticed that may be Xanax had helped somewhat with these palpitations.  They are occasionally associated with shortness of breath and  dizziness.  ZIO monitor was performed which demonstrated occasional PVCs and supraventricular ectopic beats with no sustained tachyarrhythmias or bradycardic rhythms. ? ?    ?  ?Previous Medical History: ?Past Medical History:  ?Diagnosis Date  ? Acne   ? Agoraphobia with panic attacks   ? Hiatal hernia   ? Hx of migraine headaches   ? ? ? ?Current Medications: ?No outpatient medications have been marked as taking for the 03/24/21 encounter (Appointment) with Orbie Pyo, MD.  ?  ? ?Allergies:    ?Wellbutrin [bupropion]  ? ?Social History:   ?Social History  ? ?Tobacco Use  ? Smoking status: Every Day  ? Smokeless tobacco: Never  ?Substance Use Topics  ? Alcohol use: Yes  ?  Alcohol/week: 0.0 standard drinks  ? Drug use: No  ?  ? ?Family Hx: ?No family history on file.  ? ?Review of Systems:   ?Please see the history of present illness.    ?All other systems reviewed and are negative. ?  ? ? ?EKGs/Labs/Other Test Reviewed:   ? ?EKG: EKG January and February 2023 demonstrates sinus rhythm with anterior T wave inversions ? ?Prior CV studies: ? ?Monitor 2/23 ?Patch Wear Time:  11 days and 19 hours (2023-02-01T18:53:11-498 to 2023-02-13T13:56:39-0500) ?  ?Patient had a min HR of 55 bpm, max HR of 137 bpm, and avg HR of 76 bpm. Predominant underlying rhythm was Sinus Rhythm. First Degree AV Block was present. 1 run  of Supraventricular Tachycardia occurred lasting 4 beats with a max rate of 115 bpm (avg 113 bpm). Isolated SVEs were rare (<1.0%), SVE Couplets were rare (<1.0%), and no SVE Triplets were present. Isolated VEs were rare (<1.0%), and no VE Couplets or VE Triplets were present. ? ?Imaging studies that I have independently reviewed today: None available ? ?Recent Labs: ?From January 2023 demonstrate a cholesterol of 168, triglycerides 172, HDL 31, LDL 106, sodium 141, potassium 4, creatinine 0.79, LFTs within normal limits, TSH 2.62, hemoglobin 14.6 ? ?Risk Assessment/Calculations:   ? ?{Does this patient  have ATRIAL FIBRILLATION?:[phone]} ?    ? ?Physical Exam:   ? ?VS:  There were no vitals taken for this visit.   ?Wt Readings from Last 3 Encounters:  ?No data found for Wt  ?  ?GENERAL:  No apparent distress, AOx3 ?HEENT:  No carotid bruits, +2 carotid impulses, no scleral icterus ?CAR: RRR Irregular RR*** no murmurs***, gallops, rubs, or thrills ?RES:  Clear to auscultation bilaterally ?ABD:  Soft, nontender, nondistended, positive bowel sounds x 4 ?VASC:  +2 radial pulses, +2 carotid pulses, palpable pedal pulses ?NEURO:  CN 2-12 grossly intact; motor and sensory grossly intact ?PSYCH:  No active depression or anxiety ?EXT:  No edema, ecchymosis, or cyanosis ? ?Signed, ?Orbie Pyo, MD  ?03/22/2021 8:54 AM    ?[facility] ?[address] [place], [place], [place]  [address] ?Phone: [phone]; Fax: [phone]  ? ?Note:  This document was prepared using Dragon voice recognition software and may include unintentional dictation errors. ?

## 2021-03-24 ENCOUNTER — Ambulatory Visit: Payer: BC Managed Care – PPO | Admitting: Internal Medicine

## 2021-03-24 DIAGNOSIS — R002 Palpitations: Secondary | ICD-10-CM

## 2021-03-24 DIAGNOSIS — I10 Essential (primary) hypertension: Secondary | ICD-10-CM

## 2021-03-24 DIAGNOSIS — Z720 Tobacco use: Secondary | ICD-10-CM

## 2021-03-24 DIAGNOSIS — E785 Hyperlipidemia, unspecified: Secondary | ICD-10-CM

## 2021-04-03 DIAGNOSIS — M7712 Lateral epicondylitis, left elbow: Secondary | ICD-10-CM | POA: Diagnosis not present

## 2021-04-12 NOTE — Progress Notes (Deleted)
?Cardiology Office Note:   ? ?Date:  04/12/2021  ? ?ID:  DELESHA POHLMAN, DOB July 21, 1970, MRN [id number] ? ?PCP:  Clovis Riley, L.August Saucer, MD ?  ?[facility] Providers ?Cardiologist:  None { ?Click to update primary MD,subspecialty MD or APP then REFRESH:1}   ? ?Referring MD: Clovis Riley, L.August Saucer, MD  ? ?No chief complaint on file. ?*** ? ?History of Present Illness:   ? ?MANAL KREUTZER is a 51 y.o. female with a hx of *** ? ?Past Medical History:  ?Diagnosis Date  ? Acne   ? Agoraphobia with panic attacks   ? Anxiety   ? Hiatal hernia   ? High cholesterol   ? Hx of migraine headaches   ? Hypertension   ? Tobacco dependence   ? ? ?*** The histories are not reviewed yet. Please review them in the "History" navigator section and refresh this SmartLink. ? ?Current Medications: ?No outpatient medications have been marked as taking for the 04/15/21 encounter (Appointment) with Meriam Sprague, MD.  ?  ? ?Allergies:   Wellbutrin [bupropion]  ? ?Social History  ? ?Socioeconomic History  ? Marital status: Divorced  ?  Spouse name: Not on file  ? Number of children: 1  ? Years of education: Not on file  ? Highest education level: Not on file  ?Occupational History  ? Not on file  ?Tobacco Use  ? Smoking status: Every Day  ? Smokeless tobacco: Never  ?Substance and Sexual Activity  ? Alcohol use: Yes  ?  Alcohol/week: 0.0 standard drinks  ? Drug use: No  ? Sexual activity: Not on file  ?Other Topics Concern  ? Not on file  ?Social History Narrative  ? Not on file  ? ?Social Determinants of Health  ? ?Financial Resource Strain: Not on file  ?Food Insecurity: Not on file  ?Transportation Needs: Not on file  ?Physical Activity: Not on file  ?Stress: Not on file  ?Social Connections: Not on file  ?  ? ?Family History: ?The patient's ***family history is not on file. ? ?ROS:   ?Please see the history of present illness.    ?*** All other systems reviewed and are negative. ? ?EKGs/Labs/Other Studies Reviewed:   ? ?The following studies  were reviewed today: ?*** ? ?EKG:  EKG is *** ordered today.  The ekg ordered today demonstrates *** ? ?Recent Labs: ?No results found for requested labs within last 8760 hours.  ?Recent Lipid Panel ?No results found for: CHOL, TRIG, HDL, CHOLHDL, VLDL, LDLCALC, LDLDIRECT ? ? ?Risk Assessment/Calculations:   ?{Does this patient have ATRIAL FIBRILLATION?:[phone]} ? ?    ? ?Physical Exam:   ? ?VS:  There were no vitals taken for this visit.   ? ?Wt Readings from Last 3 Encounters:  ?No data found for Wt  ?  ? ?GEN: *** Well nourished, well developed in no acute distress ?HEENT: Normal ?NECK: No JVD; No carotid bruits ?LYMPHATICS: No lymphadenopathy ?CARDIAC: ***RRR, no murmurs, rubs, gallops ?RESPIRATORY:  Clear to auscultation without rales, wheezing or rhonchi  ?ABDOMEN: Soft, non-tender, non-distended ?MUSCULOSKELETAL:  No edema; No deformity  ?SKIN: Warm and dry ?NEUROLOGIC:  Alert and oriented x 3 ?PSYCHIATRIC:  Normal affect  ? ?ASSESSMENT:   ? ?No diagnosis found. ?PLAN:   ? ?In order of problems listed above: ? ?*** ? ?   ? ?{Are you ordering a CV Procedure (e.g. stress test, cath, DCCV, TEE, etc)?   Press F2        :[id number]}  ? ? ?  Medication Adjustments/Labs and Tests Ordered: ?Current medicines are reviewed at length with the patient today.  Concerns regarding medicines are outlined above.  ?No orders of the defined types were placed in this encounter. ? ?No orders of the defined types were placed in this encounter. ? ? ?There are no Patient Instructions on file for this visit.  ? ?Signed, ?Meriam Sprague, MD  ?04/12/2021 6:13 PM    ?[facility] ?

## 2021-04-15 ENCOUNTER — Ambulatory Visit: Payer: BC Managed Care – PPO | Admitting: Cardiology

## 2021-04-24 NOTE — Progress Notes (Incomplete)
CARDIOLOGY CONSULT NOTE  ? ? ? ? ? ?Patient ID: ?Victoria Ballard ?MRN: [id number] ?DOB/AGE: 18-Apr-1970 50 y.o. ? ?Admit date: (Not on file) ?Referring Physician: August Saucer ?Primary Physician: Clovis Riley, L.August Saucer, MD ?Primary Cardiologist: New ?Reason for Consultation: palpitations ? ?Active Problems: ?  * No active hospital problems. * ? ? ?HPI:  51 y.o. referred by Dr August Saucer for palpitations History of HTN, HLD, smoking Hiatal hernia, migraines, Anxiety, Agoraphobia and panic attacks Zio monitor read by Dr Elease Hashimoto 02/09/21 sowed SR rare PACls on run SVT only 4 beats no significant arrhythmia  ? ?Lab review TSH normal 2.6 Hct 42.9 K4.0 normal BUN/CR Normal LFTls LDL 106  ? ?ECG 02/05/21 SR normal PR anterolateral T wave changes  ? ?*** ? ?ROS ?All other systems reviewed and negative except as noted above ? ?Past Medical History:  ?Diagnosis Date  ?? Acne   ?? Agoraphobia with panic attacks   ?? Anxiety   ?? Hiatal hernia   ?? High cholesterol   ?? Hx of migraine headaches   ?? Hypertension   ?? Tobacco dependence   ?  ?No family history on file.  ?Social History  ? ?Socioeconomic History  ?? Marital status: Divorced  ?  Spouse name: Not on file  ?? Number of children: 1  ?? Years of education: Not on file  ?? Highest education level: Not on file  ?Occupational History  ?? Not on file  ?Tobacco Use  ?? Smoking status: Every Day  ?? Smokeless tobacco: Never  ?Substance and Sexual Activity  ?? Alcohol use: Yes  ?  Alcohol/week: 0.0 standard drinks  ?? Drug use: No  ?? Sexual activity: Not on file  ?Other Topics Concern  ?? Not on file  ?Social History Narrative  ?? Not on file  ? ?Social Determinants of Health  ? ?Financial Resource Strain: Not on file  ?Food Insecurity: Not on file  ?Transportation Needs: Not on file  ?Physical Activity: Not on file  ?Stress: Not on file  ?Social Connections: Not on file  ?Intimate Partner Violence: Not on file  ?  ?*** The histories are not reviewed yet. Please review them in the "History"  navigator section and refresh this SmartLink.  ? ? ?Current Outpatient Medications:  ??  ALPRAZolam (XANAX) 0.25 MG tablet, Take 0.25 mg by mouth at bedtime as needed for anxiety., Disp: , Rfl:  ??  PARoxetine (PAXIL) 20 MG tablet, Take 20 mg by mouth daily., Disp: , Rfl:  ??  terbinafine (LAMISIL) 250 MG tablet, Take 1 tablet (250 mg total) by mouth daily., Disp: 90 tablet, Rfl: 0 ??  urea (GORDONS UREA) 40 % ointment, Apply topically as needed., Disp: 30 g, Rfl: 0 ? ? ? ?Physical Exam: ?There were no vitals taken for this visit.   ? ?Affect appropriate ?Healthy:  appears stated age ?HEENT: normal ?Neck supple with no adenopathy ?JVP normal no bruits no thyromegaly ?Lungs clear with no wheezing and good diaphragmatic motion ?Heart:  S1/S2 no murmur, no rub, gallop or click ?PMI normal ?Abdomen: benighn, BS positve, no tenderness, no AAA ?no bruit.  No HSM or HJR ?Distal pulses intact with no bruits ?No edema ?Neuro non-focal ?Skin warm and dry ?No muscular weakness ? ? ?Labs: ?  ?Lab Results  ?Component Value Date  ? WBC 13.4 (H) 10/04/2007  ? HGB 13.5 02/07/2008  ? HCT 40.4 02/07/2008  ? MCV 91.3 10/04/2007  ? PLT 266 10/04/2007  ? No results for input(s): NA, K, CL, CO2, BUN,  CREATININE, CALCIUM, PROT, BILITOT, ALKPHOS, ALT, AST, GLUCOSE in the last 168 hours. ? ?Invalid input(s): LABALBU ?No results found for: CKTOTAL, CKMB, CKMBINDEX, TROPONINI No results found for: CHOL ?No results found for: HDL ?No results found for: LDLCALC ?No results found for: TRIG ?No results found for: CHOLHDL ?No results found for: LDLDIRECT  ?  ?Radiology: ?No results found. ? ?EKG: see HPI ? ? ?ASSESSMENT AND PLAN:  ? ?Palpitations : benign no high risk features on ECG but abnormal precordial T waves check TTE Monitor done January benign see above Labs including Hct/TSH ok ?Smoking: counseled on cessation and nicotine able to cause palpitations CXR ?HLD:  started on statin by primary f/u labs ?Anxiety/panic attacks:  Paxil / Xanax   ?CAD: risk suggested f/u calcium score  ? ?CXR ?Echo  ?Calcium score ? ?Signed: ?Charlton Haws ?04/24/2021, 4:18 PM ? ? ?

## 2021-05-01 ENCOUNTER — Ambulatory Visit: Payer: BC Managed Care – PPO | Admitting: Cardiovascular Disease

## 2021-05-13 ENCOUNTER — Ambulatory Visit: Payer: BC Managed Care – PPO | Admitting: Interventional Cardiology

## 2021-05-25 NOTE — Progress Notes (Deleted)
Cardiology Office Note:    Date:  05/25/2021   ID:  Victoria Ballard, DOB October 21, 1970, MRN [id number]  PCP:  Aurea Graff.Marlou Sa, MD  Cardiologist:  None   Referring MD: Alroy Dust, Carlean Jews.Marlou Sa, MD   No chief complaint on file.   History of Present Illness:    Victoria Ballard is a 51 y.o. female with a hx of anxiety problems, essential hypertension, hyperlipidemia, and tobacco use who is being seen for palpitations.  ***  Past Medical History:  Diagnosis Date   Acne    Agoraphobia with panic attacks    Anxiety    Hiatal hernia    High cholesterol    Hx of migraine headaches    Hypertension    Tobacco dependence     *** The histories are not reviewed yet. Please review them in the "History" navigator section and refresh this [place].  Current Medications: No outpatient medications have been marked as taking for the 05/26/21 encounter (Appointment) with Belva Crome, MD.     Allergies:   Wellbutrin [bupropion]   Social History   Socioeconomic History   Marital status: Divorced    Spouse name: Not on file   Number of children: 1   Years of education: Not on file   Highest education level: Not on file  Occupational History   Not on file  Tobacco Use   Smoking status: Every Day   Smokeless tobacco: Never  Substance and Sexual Activity   Alcohol use: Yes    Alcohol/week: 0.0 standard drinks   Drug use: No   Sexual activity: Not on file  Other Topics Concern   Not on file  Social History Narrative   Not on file   Social Determinants of Health   Financial Resource Strain: Not on file  Food Insecurity: Not on file  Transportation Needs: Not on file  Physical Activity: Not on file  Stress: Not on file  Social Connections: Not on file     Family History: The patient's family history is not on file.  ROS:   Please see the history of present illness.    *** All other systems reviewed and are negative.  EKGs/Labs/Other Studies Reviewed:    The following  studies were reviewed today:  MONITOR 03/10/2021 Study Highlights    Predominately sinus rhythm including normal sinus rhythm, sinus tachycardia, sinus bradycardia Very rare Premature ventricular contractions     Patch Wear Time:  11 days and 19 hours (2023-02-01T18:53:11-498 to 2023-02-13T13:56:39-0500)   Patient had a min HR of 55 bpm, max HR of 137 bpm, and avg HR of 76 bpm. Predominant underlying rhythm was Sinus Rhythm. First Degree AV Block was present. 1 run of Supraventricular Tachycardia occurred lasting 4 beats with a max rate of 115 bpm (avg 113  bpm). Isolated SVEs were rare (<1.0%), SVE Couplets were rare (<1.0%), and no SVE Triplets were present. Isolated VEs were rare (<1.0%), and no VE Couplets or VE Triplets were present.   EKG:  EKG ***  Recent Labs: No results found for requested labs within last 8760 hours.  Recent Lipid Panel No results found for: CHOL, TRIG, HDL, CHOLHDL, VLDL, LDLCALC, LDLDIRECT  Physical Exam:    VS:  There were no vitals taken for this visit.    Wt Readings from Last 3 Encounters:  No data found for Wt     GEN: ***. No acute distress HEENT: Normal NECK: No JVD. LYMPHATICS: No lymphadenopathy CARDIAC: *** murmur. RRR *** gallop, or edema. VASCULAR: ***  Normal Pulses. No bruits. RESPIRATORY:  Clear to auscultation without rales, wheezing or rhonchi  ABDOMEN: Soft, non-tender, non-distended, No pulsatile mass, MUSCULOSKELETAL: No deformity  SKIN: Warm and dry NEUROLOGIC:  Alert and oriented x 3 PSYCHIATRIC:  Normal affect   ASSESSMENT:    1. Palpitations   2. Primary hypertension   3. Hyperlipidemia LDL goal <70   4. Tobacco abuse    PLAN:    In order of problems listed above:  ***   Medication Adjustments/Labs and Tests Ordered: Current medicines are reviewed at length with the patient today.  Concerns regarding medicines are outlined above.  No orders of the defined types were placed in this encounter.  No orders  of the defined types were placed in this encounter.   There are no Patient Instructions on file for this visit.   Signed, Sinclair Grooms, MD  05/25/2021 4:14 PM    [facility]

## 2021-05-26 ENCOUNTER — Ambulatory Visit: Payer: BC Managed Care – PPO | Admitting: Interventional Cardiology

## 2021-05-26 DIAGNOSIS — I10 Essential (primary) hypertension: Secondary | ICD-10-CM

## 2021-05-26 DIAGNOSIS — Z720 Tobacco use: Secondary | ICD-10-CM

## 2021-05-26 DIAGNOSIS — E785 Hyperlipidemia, unspecified: Secondary | ICD-10-CM

## 2021-05-26 DIAGNOSIS — R002 Palpitations: Secondary | ICD-10-CM

## 2021-12-09 DIAGNOSIS — S9032XA Contusion of left foot, initial encounter: Secondary | ICD-10-CM | POA: Diagnosis not present

## 2021-12-12 DIAGNOSIS — M79672 Pain in left foot: Secondary | ICD-10-CM | POA: Diagnosis not present

## 2021-12-16 DIAGNOSIS — M25572 Pain in left ankle and joints of left foot: Secondary | ICD-10-CM | POA: Diagnosis not present

## 2021-12-16 DIAGNOSIS — S9782XA Crushing injury of left foot, initial encounter: Secondary | ICD-10-CM | POA: Diagnosis not present

## 2021-12-17 ENCOUNTER — Other Ambulatory Visit: Payer: Self-pay | Admitting: Orthopedic Surgery

## 2021-12-17 DIAGNOSIS — S9782XA Crushing injury of left foot, initial encounter: Secondary | ICD-10-CM

## 2021-12-18 DIAGNOSIS — M1612 Unilateral primary osteoarthritis, left hip: Secondary | ICD-10-CM | POA: Diagnosis not present

## 2022-01-22 ENCOUNTER — Ambulatory Visit
Admission: RE | Admit: 2022-01-22 | Discharge: 2022-01-22 | Disposition: A | Discharge status: Home / Self Care | Payer: BC Managed Care – PPO | Source: Ambulatory Visit | Attending: Orthopedic Surgery | Admitting: Orthopedic Surgery

## 2022-01-22 DIAGNOSIS — S92315A Nondisplaced fracture of first metatarsal bone, left foot, initial encounter for closed fracture: Secondary | ICD-10-CM | POA: Diagnosis not present

## 2022-01-22 DIAGNOSIS — S9782XA Crushing injury of left foot, initial encounter: Secondary | ICD-10-CM

## 2022-01-25 DIAGNOSIS — M1612 Unilateral primary osteoarthritis, left hip: Secondary | ICD-10-CM | POA: Diagnosis not present

## 2022-02-01 DIAGNOSIS — S93422D Sprain of deltoid ligament of left ankle, subsequent encounter: Secondary | ICD-10-CM | POA: Diagnosis not present

## 2022-03-29 DIAGNOSIS — E7800 Pure hypercholesterolemia, unspecified: Secondary | ICD-10-CM | POA: Diagnosis not present

## 2022-03-29 DIAGNOSIS — Z23 Encounter for immunization: Secondary | ICD-10-CM | POA: Diagnosis not present

## 2022-03-29 DIAGNOSIS — I10 Essential (primary) hypertension: Secondary | ICD-10-CM | POA: Diagnosis not present

## 2022-03-29 DIAGNOSIS — Z0000 Encounter for general adult medical examination without abnormal findings: Secondary | ICD-10-CM | POA: Diagnosis not present

## 2022-03-29 DIAGNOSIS — F17200 Nicotine dependence, unspecified, uncomplicated: Secondary | ICD-10-CM | POA: Diagnosis not present

## 2022-03-29 DIAGNOSIS — G43909 Migraine, unspecified, not intractable, without status migrainosus: Secondary | ICD-10-CM | POA: Diagnosis not present

## 2022-08-04 DIAGNOSIS — K621 Rectal polyp: Secondary | ICD-10-CM | POA: Diagnosis not present

## 2022-08-04 DIAGNOSIS — Z1211 Encounter for screening for malignant neoplasm of colon: Secondary | ICD-10-CM | POA: Diagnosis not present

## 2022-09-09 ENCOUNTER — Other Ambulatory Visit: Payer: Self-pay | Admitting: Obstetrics and Gynecology

## 2022-09-09 DIAGNOSIS — R928 Other abnormal and inconclusive findings on diagnostic imaging of breast: Secondary | ICD-10-CM

## 2022-10-08 ENCOUNTER — Other Ambulatory Visit: Payer: BC Managed Care – PPO

## 2022-10-14 ENCOUNTER — Ambulatory Visit: Admission: RE | Admit: 2022-10-14 | Payer: BC Managed Care – PPO | Source: Ambulatory Visit

## 2022-10-14 ENCOUNTER — Ambulatory Visit
Admission: RE | Admit: 2022-10-14 | Discharge: 2022-10-14 | Disposition: A | Discharge status: Home / Self Care | Payer: BC Managed Care – PPO | Source: Ambulatory Visit | Attending: Obstetrics and Gynecology | Admitting: Obstetrics and Gynecology

## 2022-10-14 DIAGNOSIS — R928 Other abnormal and inconclusive findings on diagnostic imaging of breast: Secondary | ICD-10-CM

## 2022-10-21 DIAGNOSIS — R059 Cough, unspecified: Secondary | ICD-10-CM | POA: Diagnosis not present

## 2022-10-21 DIAGNOSIS — Z23 Encounter for immunization: Secondary | ICD-10-CM | POA: Diagnosis not present

## 2022-10-21 DIAGNOSIS — F17200 Nicotine dependence, unspecified, uncomplicated: Secondary | ICD-10-CM | POA: Diagnosis not present

## 2022-10-21 DIAGNOSIS — R062 Wheezing: Secondary | ICD-10-CM | POA: Diagnosis not present

## 2022-10-21 DIAGNOSIS — K219 Gastro-esophageal reflux disease without esophagitis: Secondary | ICD-10-CM | POA: Diagnosis not present

## 2022-10-22 ENCOUNTER — Other Ambulatory Visit: Payer: Self-pay | Admitting: Physician Assistant

## 2022-10-22 ENCOUNTER — Ambulatory Visit
Admission: RE | Admit: 2022-10-22 | Discharge: 2022-10-22 | Disposition: A | Discharge status: Home / Self Care | Payer: BC Managed Care – PPO | Source: Ambulatory Visit | Attending: Physician Assistant | Admitting: Physician Assistant

## 2022-10-22 DIAGNOSIS — R059 Cough, unspecified: Secondary | ICD-10-CM

## 2022-10-22 DIAGNOSIS — R062 Wheezing: Secondary | ICD-10-CM | POA: Diagnosis not present

## 2022-12-17 DIAGNOSIS — R062 Wheezing: Secondary | ICD-10-CM | POA: Diagnosis not present

## 2022-12-17 DIAGNOSIS — J989 Respiratory disorder, unspecified: Secondary | ICD-10-CM | POA: Diagnosis not present

## 2023-02-21 DIAGNOSIS — Z30432 Encounter for removal of intrauterine contraceptive device: Secondary | ICD-10-CM | POA: Diagnosis not present

## 2023-02-21 DIAGNOSIS — Z6831 Body mass index (BMI) 31.0-31.9, adult: Secondary | ICD-10-CM | POA: Diagnosis not present

## 2023-02-21 DIAGNOSIS — Z124 Encounter for screening for malignant neoplasm of cervix: Secondary | ICD-10-CM | POA: Diagnosis not present

## 2023-02-21 DIAGNOSIS — Z01419 Encounter for gynecological examination (general) (routine) without abnormal findings: Secondary | ICD-10-CM | POA: Diagnosis not present

## 2023-04-06 DIAGNOSIS — Z0000 Encounter for general adult medical examination without abnormal findings: Secondary | ICD-10-CM | POA: Diagnosis not present

## 2023-04-06 DIAGNOSIS — E7800 Pure hypercholesterolemia, unspecified: Secondary | ICD-10-CM | POA: Diagnosis not present

## 2023-04-06 DIAGNOSIS — G43909 Migraine, unspecified, not intractable, without status migrainosus: Secondary | ICD-10-CM | POA: Diagnosis not present

## 2023-04-06 DIAGNOSIS — I10 Essential (primary) hypertension: Secondary | ICD-10-CM | POA: Diagnosis not present

## 2023-04-06 DIAGNOSIS — F419 Anxiety disorder, unspecified: Secondary | ICD-10-CM | POA: Diagnosis not present

## 2023-04-06 DIAGNOSIS — Z23 Encounter for immunization: Secondary | ICD-10-CM | POA: Diagnosis not present

## 2023-07-19 DIAGNOSIS — E7800 Pure hypercholesterolemia, unspecified: Secondary | ICD-10-CM | POA: Diagnosis not present

## 2023-12-30 ENCOUNTER — Telehealth

## 2023-12-30 DIAGNOSIS — J329 Chronic sinusitis, unspecified: Secondary | ICD-10-CM

## 2023-12-30 DIAGNOSIS — R051 Acute cough: Secondary | ICD-10-CM | POA: Diagnosis not present

## 2023-12-30 MED ORDER — AMOXICILLIN-POT CLAVULANATE 875-125 MG PO TABS: 1.0000 | ORAL_TABLET | Freq: Two times a day (BID) | ORAL | 0 refills | Status: AC

## 2023-12-30 NOTE — Progress Notes (Signed)
 " Virtual Visit Consent   Victoria Ballard, you are scheduled for a virtual visit with a [facility] provider today. Just as with appointments in the office, your consent must be obtained to participate. Your consent will be active for this visit and any virtual visit you may have with one of our providers in the next 365 days. If you have a MyChart account, a copy of this consent can be sent to you electronically.  As this is a virtual visit, video technology does not allow for your provider to perform a traditional examination. This may limit your provider's ability to fully assess your condition. If your provider identifies any concerns that need to be evaluated in person or the need to arrange testing (such as labs, EKG, etc.), we will make arrangements to do so. Although advances in technology are sophisticated, we cannot ensure that it will always work on either your end or our end. If the connection with a video visit is poor, the visit may have to be switched to a telephone visit. With either a video or telephone visit, we are not always able to ensure that we have a secure connection.  By engaging in this virtual visit, you consent to the provision of healthcare and authorize for your insurance to be billed (if applicable) for the services provided during this visit. Depending on your insurance coverage, you may receive a charge related to this service.  I need to obtain your verbal consent now. Are you willing to proceed with your visit today? DELAYNA SPARLIN has provided verbal consent on 12/30/2023 for a virtual visit (video or telephone). Lamar Schlossman, PA-C  Date: 12/30/2023 11:12 AM   Virtual Visit via Video Note   I, Lamar Schlossman, connected with  Victoria Ballard  ([id number], [birth year]) on 12/30/2023 at 11:15 AM EST by a video-enabled telemedicine application and verified that I am speaking with the correct person using two identifiers.  Location: Patient: Virtual Visit Location  Patient: Home Provider: Virtual Visit Location Provider: Home Office   I discussed the limitations of evaluation and management by telemedicine and the availability of in person appointments. The patient expressed understanding and agreed to proceed.    History of Present Illness: Victoria Ballard is a 53 y.o. who identifies as a female who was assigned female at birth, and is being seen today for cold and chest congestion.  States that she has been having severe cough and congestion.  States that she has been sick for just about a week now and isn't having any improvement.  HPI: HPI  Problems: There are no active problems to display for this patient.   Allergies: Allergies[1] Medications: Current Medications[2]  Observations/Objective: Patient is well-developed, well-nourished in no acute distress.  Resting comfortably at home.  Head is normocephalic, atraumatic.  No labored breathing.  Speech is clear and coherent with logical content.  Patient is alert and oriented at baseline.    Assessment and Plan: 1. Acute cough (Primary)  2. Sinusitis, unspecified chronicity, unspecified location  Meds ordered this encounter  Medications   amoxicillin-clavulanate (AUGMENTIN) 875-125 MG tablet    Sig: Take 1 tablet by mouth every 12 (twelve) hours.    Dispense:  14 tablet    Refill:  0    Supervising Provider:   BLAISE ALEENE KIDD [[id number]]     Follow Up Instructions: I discussed the assessment and treatment plan with the patient. The patient was provided an opportunity to ask questions and all were  answered. The patient agreed with the plan and demonstrated an understanding of the instructions.  A copy of instructions were sent to the patient via MyChart unless otherwise noted below.     The patient was advised to call back or seek an in-person evaluation if the symptoms worsen or if the condition fails to improve as anticipated.    Lamar Schlossman, PA-C     [1]   Allergies Allergen Reactions   Wellbutrin [Bupropion] Hives  [2]  Current Outpatient Medications:    amoxicillin-clavulanate (AUGMENTIN) 875-125 MG tablet, Take 1 tablet by mouth every 12 (twelve) hours., Disp: 14 tablet, Rfl: 0   ALPRAZolam (XANAX) 0.25 MG tablet, Take 0.25 mg by mouth at bedtime as needed for anxiety., Disp: , Rfl:    PARoxetine (PAXIL) 20 MG tablet, Take 20 mg by mouth daily., Disp: , Rfl:    terbinafine  (LAMISIL ) 250 MG tablet, Take 1 tablet (250 mg total) by mouth daily., Disp: 90 tablet, Rfl: 0   urea  (GORDONS UREA ) 40 % ointment, Apply topically as needed., Disp: 30 g, Rfl: 0  "

## 2023-12-30 NOTE — Patient Instructions (Signed)
" °  Jon JAYSON Rakers, thank you for joining Lamar Schlossman, PA-C for today's virtual visit.  While this provider is not your primary care provider (PCP), if your PCP is located in our provider database this encounter information will be shared with them immediately following your visit.   A [facility] MyChart account gives you access to today's visit and all your visits, tests, and labs performed at [facility]  click here if you don't have a [facility] MyChart account or go to [email or web address]  Consent: (Patient) Victoria Ballard provided verbal consent for this virtual visit at the beginning of the encounter.  Current Medications:  Current Outpatient Medications:    amoxicillin-clavulanate (AUGMENTIN) 875-125 MG tablet, Take 1 tablet by mouth every 12 (twelve) hours., Disp: 14 tablet, Rfl: 0   ALPRAZolam (XANAX) 0.25 MG tablet, Take 0.25 mg by mouth at bedtime as needed for anxiety., Disp: , Rfl:    PARoxetine (PAXIL) 20 MG tablet, Take 20 mg by mouth daily., Disp: , Rfl:    terbinafine  (LAMISIL ) 250 MG tablet, Take 1 tablet (250 mg total) by mouth daily., Disp: 90 tablet, Rfl: 0   urea  (GORDONS UREA ) 40 % ointment, Apply topically as needed., Disp: 30 g, Rfl: 0   Medications ordered in this encounter:  Meds ordered this encounter  Medications   amoxicillin-clavulanate (AUGMENTIN) 875-125 MG tablet    Sig: Take 1 tablet by mouth every 12 (twelve) hours.    Dispense:  14 tablet    Refill:  0    Supervising Provider:   BLAISE ALEENE KIDD [[id number]]     *If you need refills on other medications prior to your next appointment, please contact your pharmacy*  Follow-Up: Call back or seek an in-person evaluation if the symptoms worsen or if the condition fails to improve as anticipated.  [facility] [phone]  Other Instructions    If you have been instructed to have an in-person evaluation today at a local Urgent Care facility, please use the link  below. It will take you to a list of all of our available [facility], including address, phone number and hours of operation. Please do not delay care.  [facility]  If you or a family member do not have a primary care provider, use the link below to schedule a visit and establish care. When you choose a [facility] primary care physician or advanced practice provider, you gain a long-term partner in health. Find a Primary Care Provider  Learn more about [facility]'s in-office and virtual care options: [facility] - Get Care Now  "
